# Patient Record
Sex: Female | Born: 1985 | Smoking: Never smoker
Health system: Southern US, Community
[De-identification: ages and names within clinical notes are randomized; demographics above are authoritative.]

## PROBLEM LIST (undated history)

## (undated) DIAGNOSIS — F329 Major depressive disorder, single episode, unspecified: Secondary | ICD-10-CM

## (undated) DIAGNOSIS — I10 Essential (primary) hypertension: Secondary | ICD-10-CM

## (undated) DIAGNOSIS — F32A Depression, unspecified: Secondary | ICD-10-CM

## (undated) DIAGNOSIS — F988 Other specified behavioral and emotional disorders with onset usually occurring in childhood and adolescence: Secondary | ICD-10-CM

## (undated) HISTORY — PX: LAPAROSCOPIC GASTRIC BANDING: SHX1100

## (undated) HISTORY — PX: LUMBAR DISC SURGERY: SHX700

---

## 2014-09-13 ENCOUNTER — Other Ambulatory Visit (HOSPITAL_COMMUNITY): Payer: Self-pay

## 2014-09-18 ENCOUNTER — Other Ambulatory Visit (HOSPITAL_COMMUNITY): Payer: Self-pay | Admitting: Specialist

## 2014-09-18 DIAGNOSIS — O365933 Maternal care for other known or suspected poor fetal growth, third trimester, fetus 3: Secondary | ICD-10-CM

## 2014-09-18 DIAGNOSIS — Z3A32 32 weeks gestation of pregnancy: Secondary | ICD-10-CM

## 2014-09-24 ENCOUNTER — Other Ambulatory Visit (HOSPITAL_COMMUNITY): Payer: Self-pay | Admitting: Specialist

## 2014-09-24 ENCOUNTER — Encounter (HOSPITAL_COMMUNITY): Payer: Self-pay

## 2014-09-24 ENCOUNTER — Ambulatory Visit (HOSPITAL_COMMUNITY)
Admission: RE | Admit: 2014-09-24 | Discharge: 2014-09-24 | Disposition: A | Discharge status: Home / Self Care | Payer: Medicaid - Out of State | Source: Ambulatory Visit | Attending: Specialist | Admitting: Specialist

## 2014-09-24 DIAGNOSIS — O365990 Maternal care for other known or suspected poor fetal growth, unspecified trimester, not applicable or unspecified: Secondary | ICD-10-CM | POA: Insufficient documentation

## 2014-09-24 DIAGNOSIS — O365933 Maternal care for other known or suspected poor fetal growth, third trimester, fetus 3: Secondary | ICD-10-CM

## 2014-09-24 DIAGNOSIS — Z3A32 32 weeks gestation of pregnancy: Secondary | ICD-10-CM

## 2014-09-24 DIAGNOSIS — O365930 Maternal care for other known or suspected poor fetal growth, third trimester, not applicable or unspecified: Secondary | ICD-10-CM | POA: Diagnosis present

## 2014-09-24 HISTORY — DX: Depression, unspecified: F32.A

## 2014-09-24 HISTORY — DX: Other specified behavioral and emotional disorders with onset usually occurring in childhood and adolescence: F98.8

## 2014-09-24 HISTORY — DX: Major depressive disorder, single episode, unspecified: F32.9

## 2014-09-24 HISTORY — DX: Essential (primary) hypertension: I10

## 2014-10-01 ENCOUNTER — Other Ambulatory Visit (HOSPITAL_COMMUNITY): Payer: Self-pay | Admitting: Maternal and Fetal Medicine

## 2014-10-01 ENCOUNTER — Other Ambulatory Visit (HOSPITAL_COMMUNITY): Payer: Self-pay

## 2014-10-01 ENCOUNTER — Ambulatory Visit (HOSPITAL_COMMUNITY): Admission: RE | Admit: 2014-10-01 | Payer: Medicaid - Out of State | Source: Ambulatory Visit

## 2014-10-01 ENCOUNTER — Ambulatory Visit (HOSPITAL_COMMUNITY)
Admission: RE | Admit: 2014-10-01 | Discharge: 2014-10-01 | Disposition: A | Discharge status: Home / Self Care | Payer: Medicaid - Out of State | Source: Ambulatory Visit | Attending: Specialist | Admitting: Specialist

## 2014-10-01 DIAGNOSIS — O99213 Obesity complicating pregnancy, third trimester: Secondary | ICD-10-CM | POA: Diagnosis not present

## 2014-10-01 DIAGNOSIS — O365930 Maternal care for other known or suspected poor fetal growth, third trimester, not applicable or unspecified: Secondary | ICD-10-CM | POA: Diagnosis not present

## 2014-10-01 DIAGNOSIS — O99210 Obesity complicating pregnancy, unspecified trimester: Secondary | ICD-10-CM | POA: Insufficient documentation

## 2014-10-01 DIAGNOSIS — Z3A33 33 weeks gestation of pregnancy: Secondary | ICD-10-CM | POA: Insufficient documentation

## 2014-10-01 DIAGNOSIS — O09293 Supervision of pregnancy with other poor reproductive or obstetric history, third trimester: Secondary | ICD-10-CM | POA: Diagnosis not present

## 2014-10-01 DIAGNOSIS — O365933 Maternal care for other known or suspected poor fetal growth, third trimester, fetus 3: Secondary | ICD-10-CM

## 2014-10-08 ENCOUNTER — Ambulatory Visit (HOSPITAL_COMMUNITY)
Admission: RE | Admit: 2014-10-08 | Discharge: 2014-10-08 | Disposition: A | Discharge status: Home / Self Care | Payer: Medicaid - Out of State | Source: Ambulatory Visit | Attending: Specialist | Admitting: Specialist

## 2014-10-08 ENCOUNTER — Encounter (HOSPITAL_COMMUNITY): Payer: Self-pay

## 2014-10-08 ENCOUNTER — Other Ambulatory Visit (HOSPITAL_COMMUNITY): Payer: Self-pay | Admitting: Maternal and Fetal Medicine

## 2014-10-08 DIAGNOSIS — Z3A34 34 weeks gestation of pregnancy: Secondary | ICD-10-CM | POA: Insufficient documentation

## 2014-10-08 DIAGNOSIS — O99213 Obesity complicating pregnancy, third trimester: Secondary | ICD-10-CM | POA: Diagnosis present

## 2014-10-08 DIAGNOSIS — O365933 Maternal care for other known or suspected poor fetal growth, third trimester, fetus 3: Secondary | ICD-10-CM

## 2014-10-08 DIAGNOSIS — O365930 Maternal care for other known or suspected poor fetal growth, third trimester, not applicable or unspecified: Secondary | ICD-10-CM | POA: Insufficient documentation

## 2014-10-08 DIAGNOSIS — O99843 Bariatric surgery status complicating pregnancy, third trimester: Secondary | ICD-10-CM | POA: Insufficient documentation

## 2014-10-08 DIAGNOSIS — O288 Other abnormal findings on antenatal screening of mother: Secondary | ICD-10-CM | POA: Insufficient documentation

## 2014-10-08 DIAGNOSIS — O99212 Obesity complicating pregnancy, second trimester: Secondary | ICD-10-CM | POA: Diagnosis not present

## 2014-10-08 DIAGNOSIS — O99210 Obesity complicating pregnancy, unspecified trimester: Secondary | ICD-10-CM | POA: Insufficient documentation

## 2014-10-08 NOTE — ED Notes (Signed)
Pt reports small gush of fluid this am, unsure if it might have been urine.  Reports frequent bladder leakage.

## 2014-10-15 ENCOUNTER — Ambulatory Visit (HOSPITAL_COMMUNITY)
Admission: RE | Admit: 2014-10-15 | Discharge: 2014-10-15 | Disposition: A | Discharge status: Home / Self Care | Payer: Medicaid - Out of State | Source: Ambulatory Visit | Attending: Specialist | Admitting: Specialist

## 2014-10-15 ENCOUNTER — Other Ambulatory Visit (HOSPITAL_COMMUNITY): Payer: Self-pay

## 2014-10-15 ENCOUNTER — Other Ambulatory Visit (HOSPITAL_COMMUNITY): Payer: Self-pay | Admitting: Maternal and Fetal Medicine

## 2014-10-15 ENCOUNTER — Ambulatory Visit (HOSPITAL_COMMUNITY): Admission: RE | Admit: 2014-10-15 | Payer: Medicaid - Out of State | Source: Ambulatory Visit

## 2014-10-15 DIAGNOSIS — Z3A35 35 weeks gestation of pregnancy: Secondary | ICD-10-CM | POA: Insufficient documentation

## 2014-10-15 DIAGNOSIS — O365933 Maternal care for other known or suspected poor fetal growth, third trimester, fetus 3: Secondary | ICD-10-CM

## 2014-10-15 DIAGNOSIS — O365930 Maternal care for other known or suspected poor fetal growth, third trimester, not applicable or unspecified: Secondary | ICD-10-CM | POA: Diagnosis present

## 2014-10-15 DIAGNOSIS — O09293 Supervision of pregnancy with other poor reproductive or obstetric history, third trimester: Secondary | ICD-10-CM | POA: Insufficient documentation

## 2014-10-15 DIAGNOSIS — O99213 Obesity complicating pregnancy, third trimester: Secondary | ICD-10-CM | POA: Insufficient documentation

## 2014-10-22 ENCOUNTER — Encounter (HOSPITAL_COMMUNITY): Payer: Self-pay

## 2014-10-22 ENCOUNTER — Ambulatory Visit (HOSPITAL_COMMUNITY)
Admission: RE | Admit: 2014-10-22 | Discharge: 2014-10-22 | Disposition: A | Discharge status: Home / Self Care | Payer: Medicaid Other | Source: Ambulatory Visit | Attending: Specialist | Admitting: Specialist

## 2014-10-22 DIAGNOSIS — O99213 Obesity complicating pregnancy, third trimester: Secondary | ICD-10-CM | POA: Insufficient documentation

## 2014-10-22 DIAGNOSIS — O09293 Supervision of pregnancy with other poor reproductive or obstetric history, third trimester: Secondary | ICD-10-CM | POA: Insufficient documentation

## 2014-10-22 DIAGNOSIS — Z3A36 36 weeks gestation of pregnancy: Secondary | ICD-10-CM | POA: Insufficient documentation

## 2014-10-22 DIAGNOSIS — O365930 Maternal care for other known or suspected poor fetal growth, third trimester, not applicable or unspecified: Secondary | ICD-10-CM | POA: Insufficient documentation

## 2014-10-22 DIAGNOSIS — O365933 Maternal care for other known or suspected poor fetal growth, third trimester, fetus 3: Secondary | ICD-10-CM

## 2014-10-23 DIAGNOSIS — Z3A36 36 weeks gestation of pregnancy: Secondary | ICD-10-CM | POA: Insufficient documentation

## 2015-07-30 ENCOUNTER — Encounter (HOSPITAL_COMMUNITY): Payer: Self-pay | Admitting: *Deleted

## 2016-07-06 IMAGING — US US OB FOLLOW-UP
1 series · 12 of 28 positions shown · non-contrast
Comparison: none

[Series 1: us ob follow-up · 0.22mm/px · 12 of 45 slices shown]
[im 2/45]
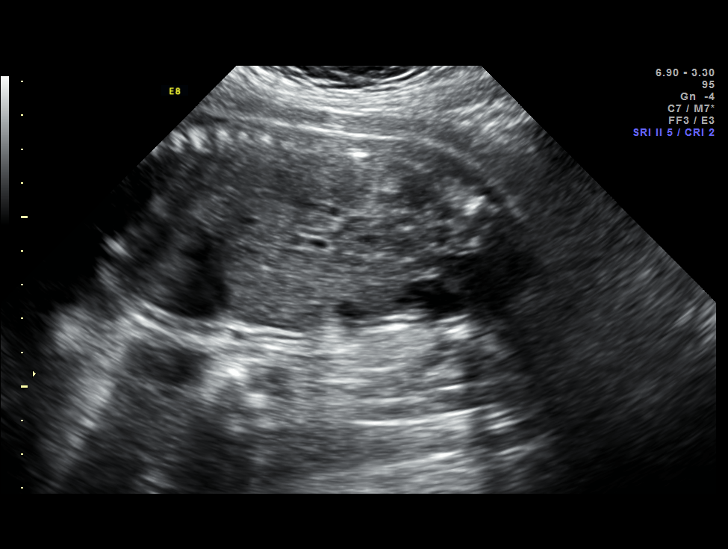
[im 5/45]
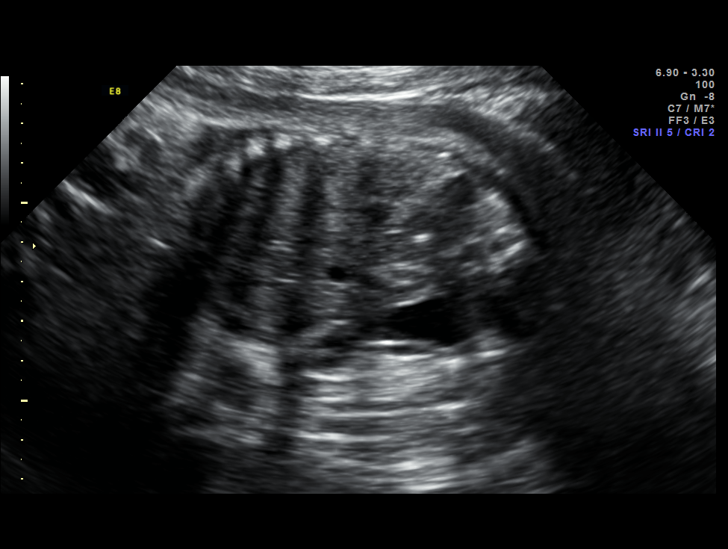
[im 9/45]
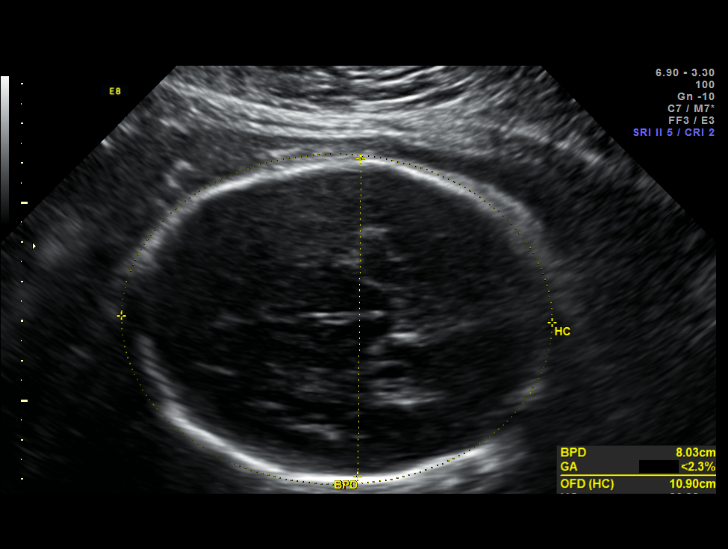
[im 14/45]
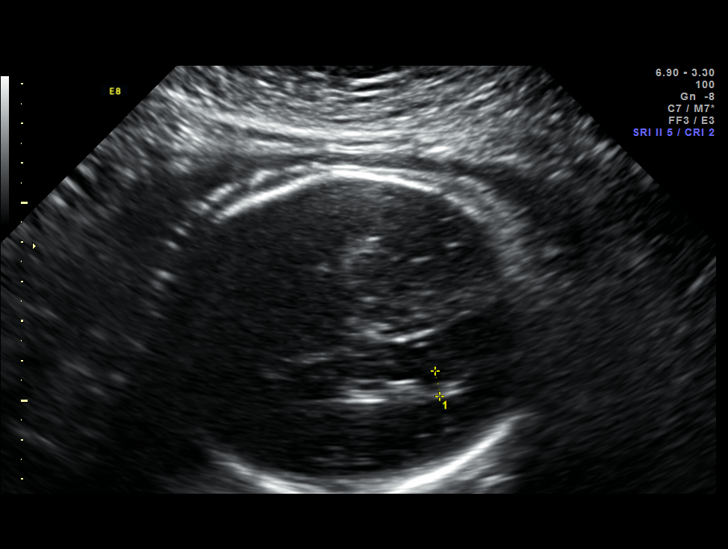
[im 17/45]
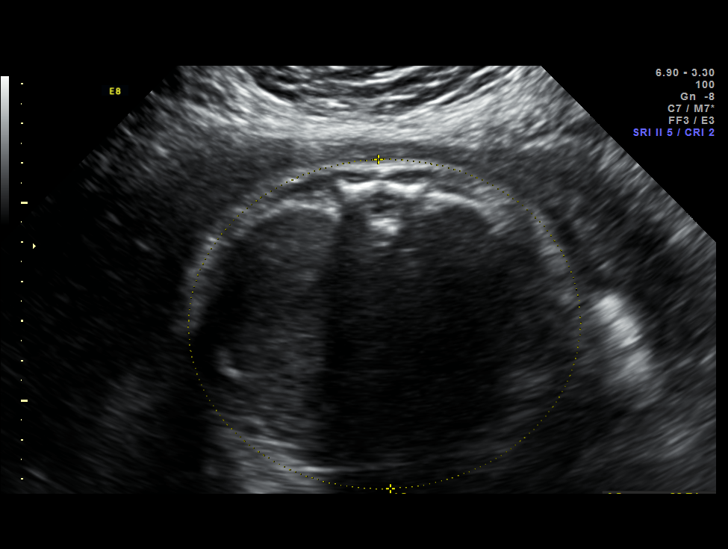
[im 20/45]
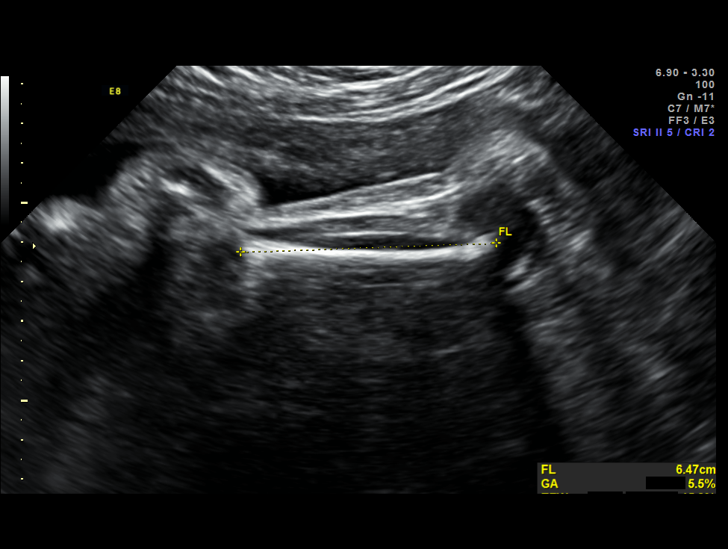
[im 25/45]
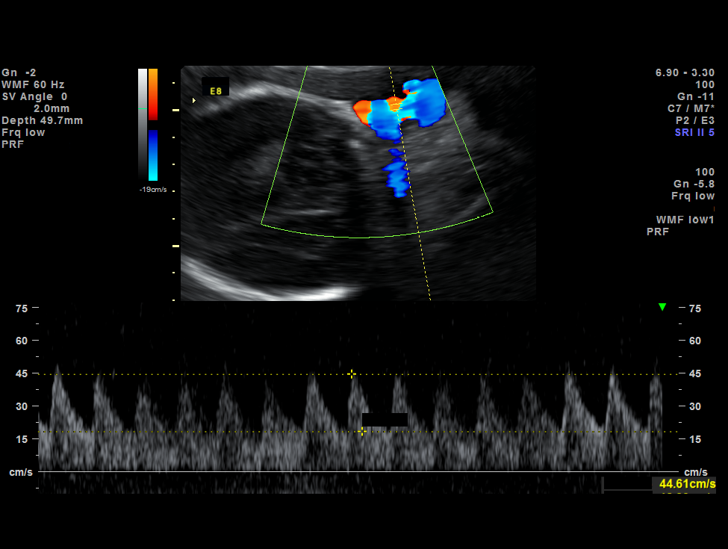
[im 28/45]
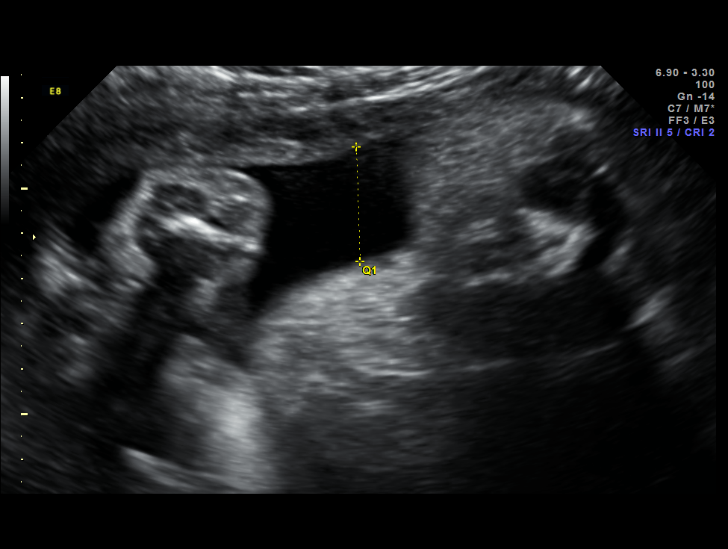
[im 31/45]
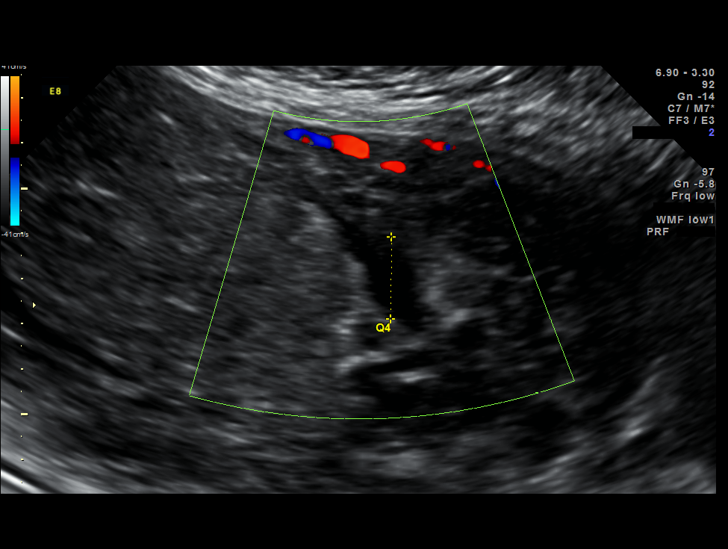
[im 36/45]
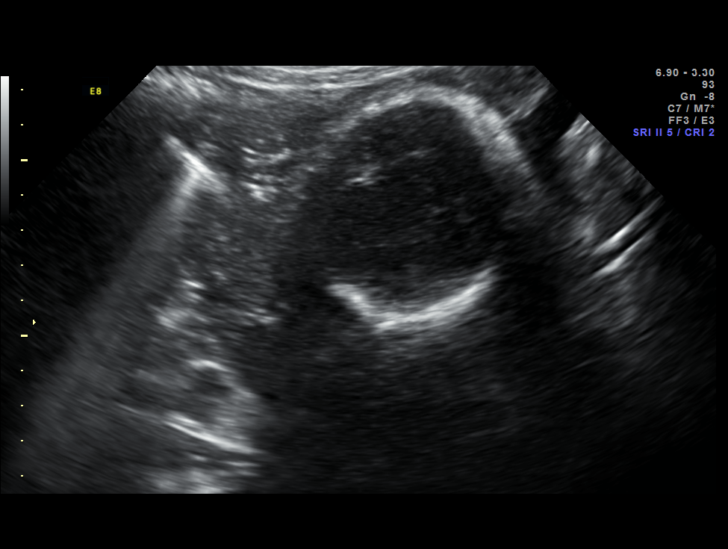
[im 40/45]
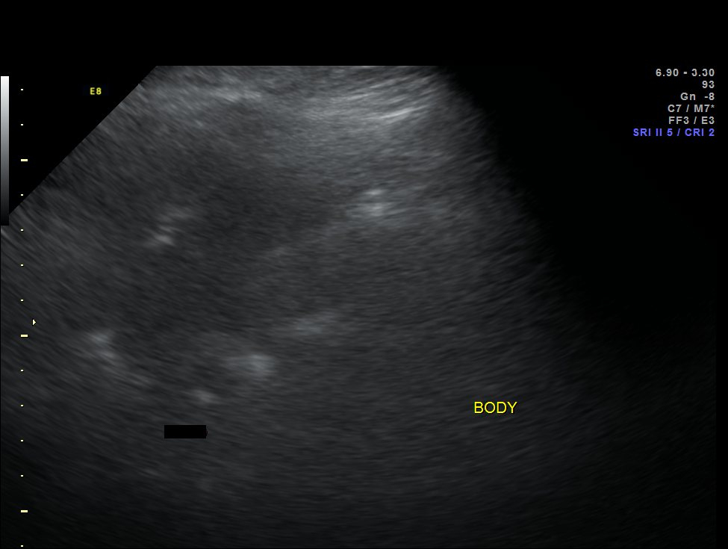
[im 43/45]
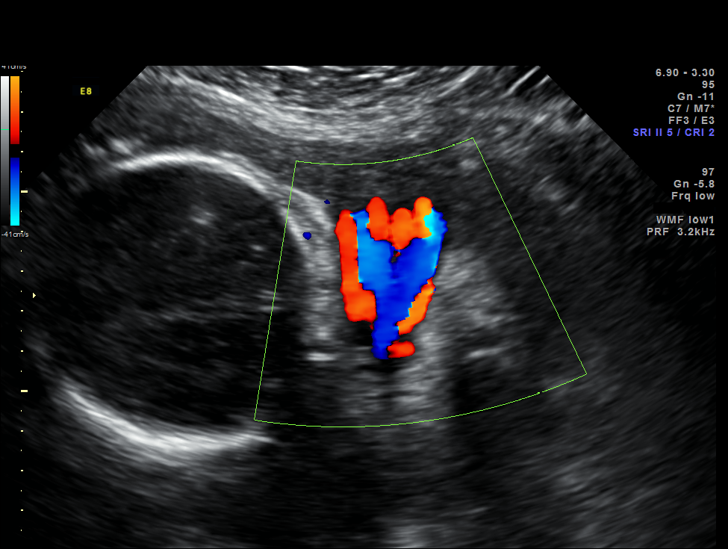

[12 of 28 positions shown; findings below may reference images not displayed]

OBSTETRICS REPORT
                      (Signed Final 10/15/2014 [DATE])

Service(s) Provided

 US OB FOLLOW UP                                       76816.1
 US UA CORD DOPPLER                                    76820.0
Indications

 Obesity complicating pregnancy, third trimester
 Poor obstetric history: Previous preeclampsia /
 eclampsia/gestational HTN
 Previous 37 week delivery
 Maternal care for known of suspected poor fetal
 growth, third trimester, not applicable or unspecified
 35 weeks gestation of pregnancy
Fetal Evaluation

 Num Of Fetuses:    1
 Fetal Heart Rate:  152                          bpm
 Cardiac Activity:  Observed
 Presentation:      Breech, footling
 Placenta:          Fundal, above cervical os

 Amniotic Fluid
 AFI FV:      Subjectively in the low normal range
 AFI Sum:     9.43    cm       17  %Tile     Larg Pckt:    3.37  cm
 RUQ:   2.54    cm   RLQ:    1.82   cm    LUQ:   3.37    cm   LLQ:    1.7    cm
Biophysical Evaluation

 Amniotic F.V:   Within normal limits       F. Tone:        Observed
 F. Movement:    Observed                   Score:          [DATE]
 F. Breathing:   Observed
Biometry

 BPD:     81.5  mm     G. Age:  32w 5d                CI:         73.6   70 - 86
 OFD:    110.7  mm                                    FL/HC:      20.8   20.1 -

 HC:     309.2  mm     G. Age:  34w 4d        7  %    HC/AC:      1.16   0.93 -

 AC:     266.7  mm     G. Age:  30w 5d      < 3  %    FL/BPD:     79.0   71 - 87
 FL:      64.4  mm     G. Age:  33w 2d        5  %    FL/AC:      24.1   20 - 24
 HUM:     56.4  mm     G. Age:  32w 6d       15  %
 CER:     43.9  mm     G. Age:  37w 6d       72  %
 Est. FW:    0155  gm      4 lb 3 oz   < 10  %
Gestational Age

 LMP:           38w 0d        Date:  01/22/14                 EDD:   10/29/14
 U/S Today:     32w 6d                                        EDD:   12/04/14
 Best:          35w 3d     Det. By:  Early Ultrasound         EDD:   11/16/14
Anatomy

 Cranium:          Appears normal         Aortic Arch:      Previously seen
 Fetal Cavum:      Appears normal         Ductal Arch:      Not well visualized
 Ventricles:       Previously seen        Diaphragm:        Previously seen
 Choroid Plexus:   Previously seen        Stomach:          Appears normal, left
                                                            sided
 Cerebellum:       Appears normal         Abdomen:          Appears normal
 Posterior Fossa:  Appears normal         Abdominal Wall:   Previously seen
 Nuchal Fold:      Not applicable (>20    Cord Vessels:     Appears normal (3
                   wks GA)                                  vessel cord)
 Face:             Orbits and profile     Kidneys:          Appear normal
                   previously seen
 Lips:             Previously seen        Bladder:          Appears normal
 Heart:            Previously seen        Spine:            Previously seen
 RVOT:             Not well visualized    Lower             Previously seen
                                          Extremities:
 LVOT:             Previously seen        Upper             Previously seen
                                          Extremities:

 Other:  Technicallly difficult due to advanced GA and maternal habitus. Male
         gender prev seen.
Doppler - Fetal Vessels

 Umbilical Artery
 S/D:   2.88           71  %tile       RI:
                                       PSV:       68.82   cm/s
 Umbilical Artery
 Absent DFV:    No     Reverse DFV:    No

Cervix Uterus Adnexa

 Cervix:       Not visualized (advanced GA >76wks)

 Left Ovary:    Not visualized.
 Right Ovary:   Not visualized.
Impression

 SIUP at 35+3 weeks
 Fetal growth restriction
 Breech presentation
 Normal interval anatomy; limited views of RVOT and DA
 Low normal amniotic fluid volume
 EFW < 10th %tile (1,899 grams/4+3); AC < 3rd %tile; overall
 measurements lagging by 2 weeks; AC lagging by 
 4 weeks;
 there has been 2 weeks of growth over the past 3 weeks
 BPP [DATE]
 UA dopplers were normal for this GA
Recommendations

 Continue weekly BPPs and UA dopplers or twice weekly
 NSTs with weekly AFIs and UA dopplers (has appt with us
 Tues [DATE])
 Would plan a delivery at 
 37 weeks as long as testing
 remains reassuring

 questions or concerns.
